# Patient Record
Sex: Male | Born: 1965 | Hispanic: No | Marital: Married | Smoking: Never smoker
Health system: Southern US, Community
[De-identification: ages and names within clinical notes are randomized; demographics above are authoritative.]

## PROBLEM LIST (undated history)

## (undated) DIAGNOSIS — I10 Essential (primary) hypertension: Secondary | ICD-10-CM

## (undated) HISTORY — PX: OTHER SURGICAL HISTORY: SHX169

---

## 2006-01-03 ENCOUNTER — Emergency Department (HOSPITAL_COMMUNITY): Admission: EM | Admit: 2006-01-03 | Discharge: 2006-01-04 | Payer: Self-pay | Admitting: Emergency Medicine

## 2007-02-12 IMAGING — CR DG CHEST 2V
2 series · 2 of 2 positions shown · non-contrast
Comparison: None.

CLINICAL DATA: Shortness of breath.
 CHEST - 2 VIEW - 01/04/06:

[w chest pa]
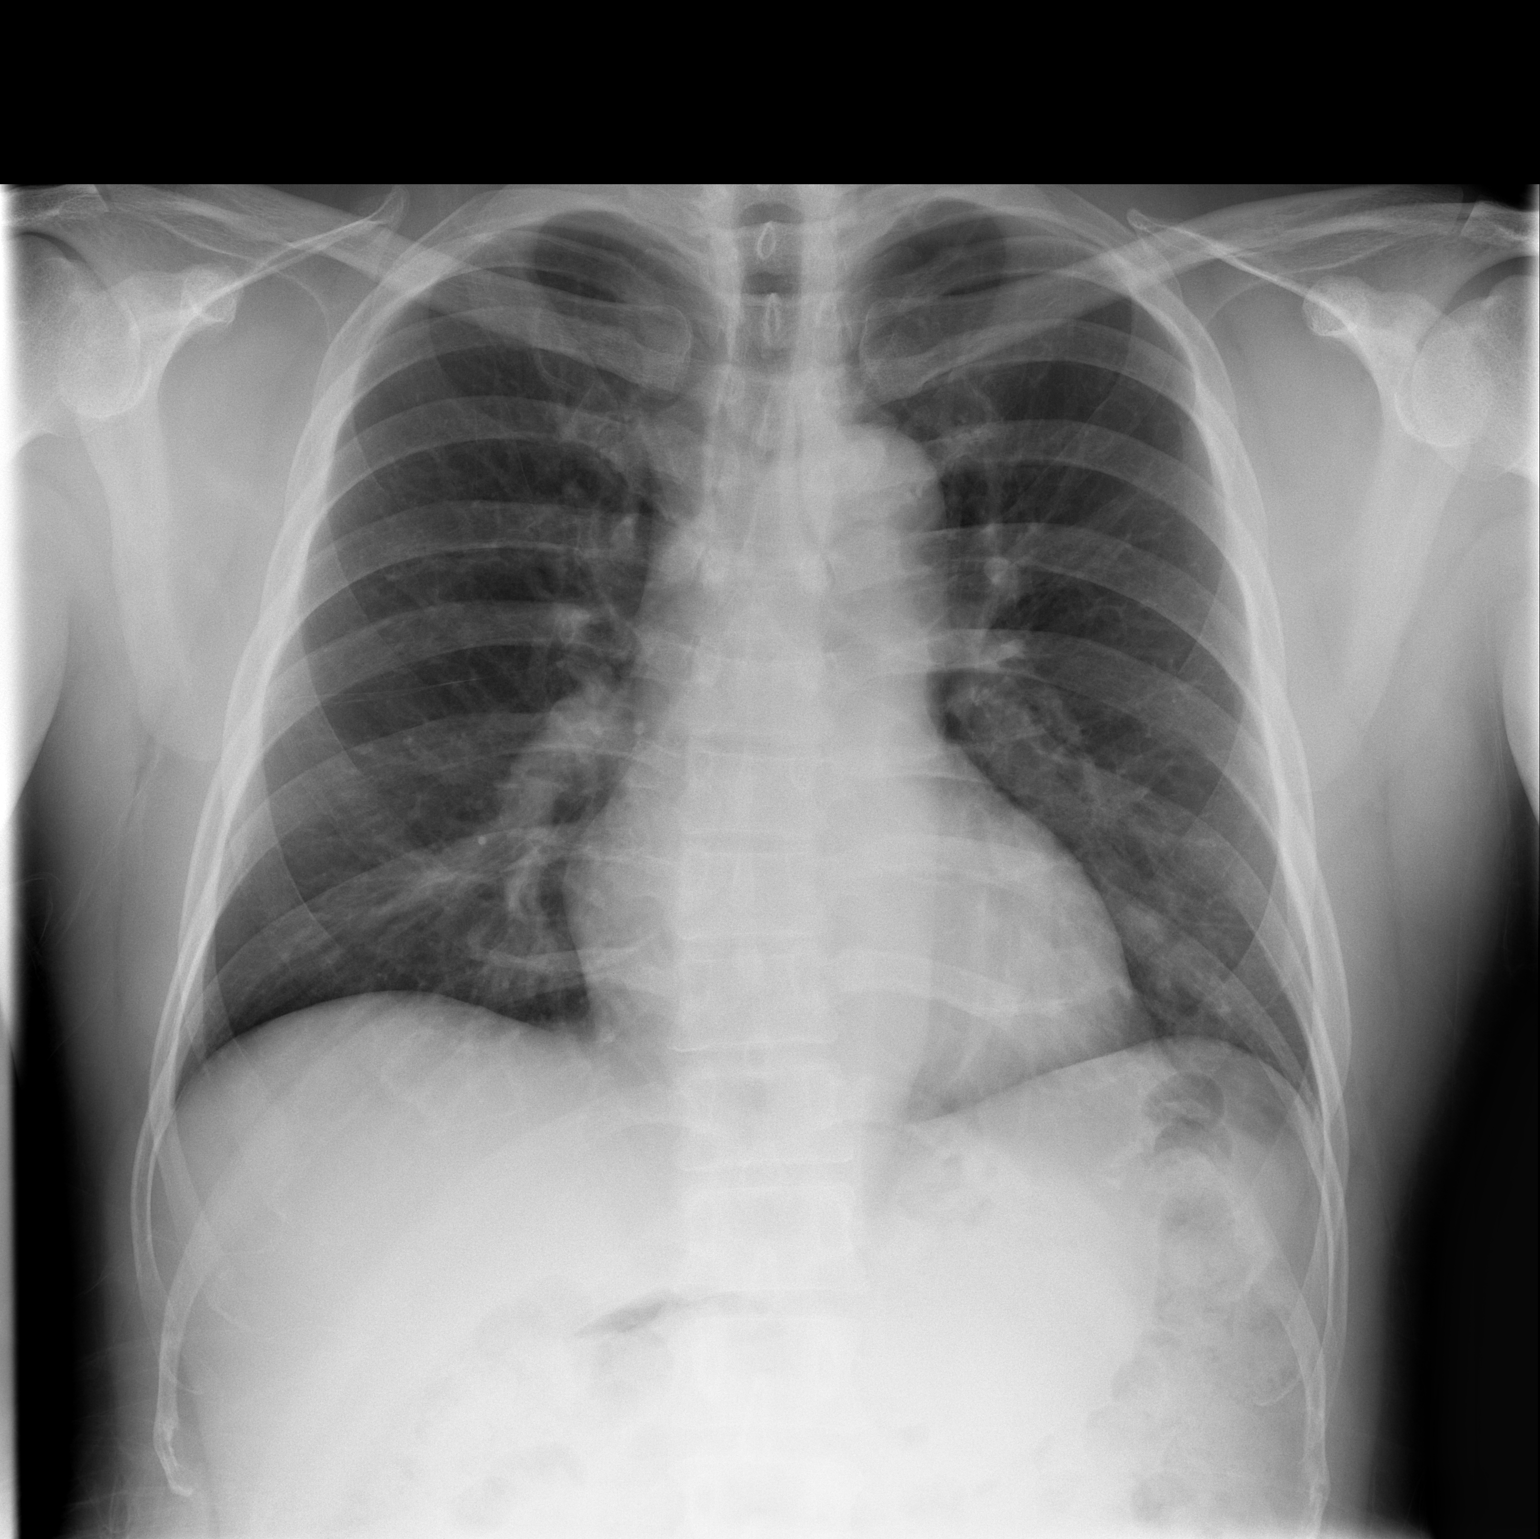

[w chest lat]
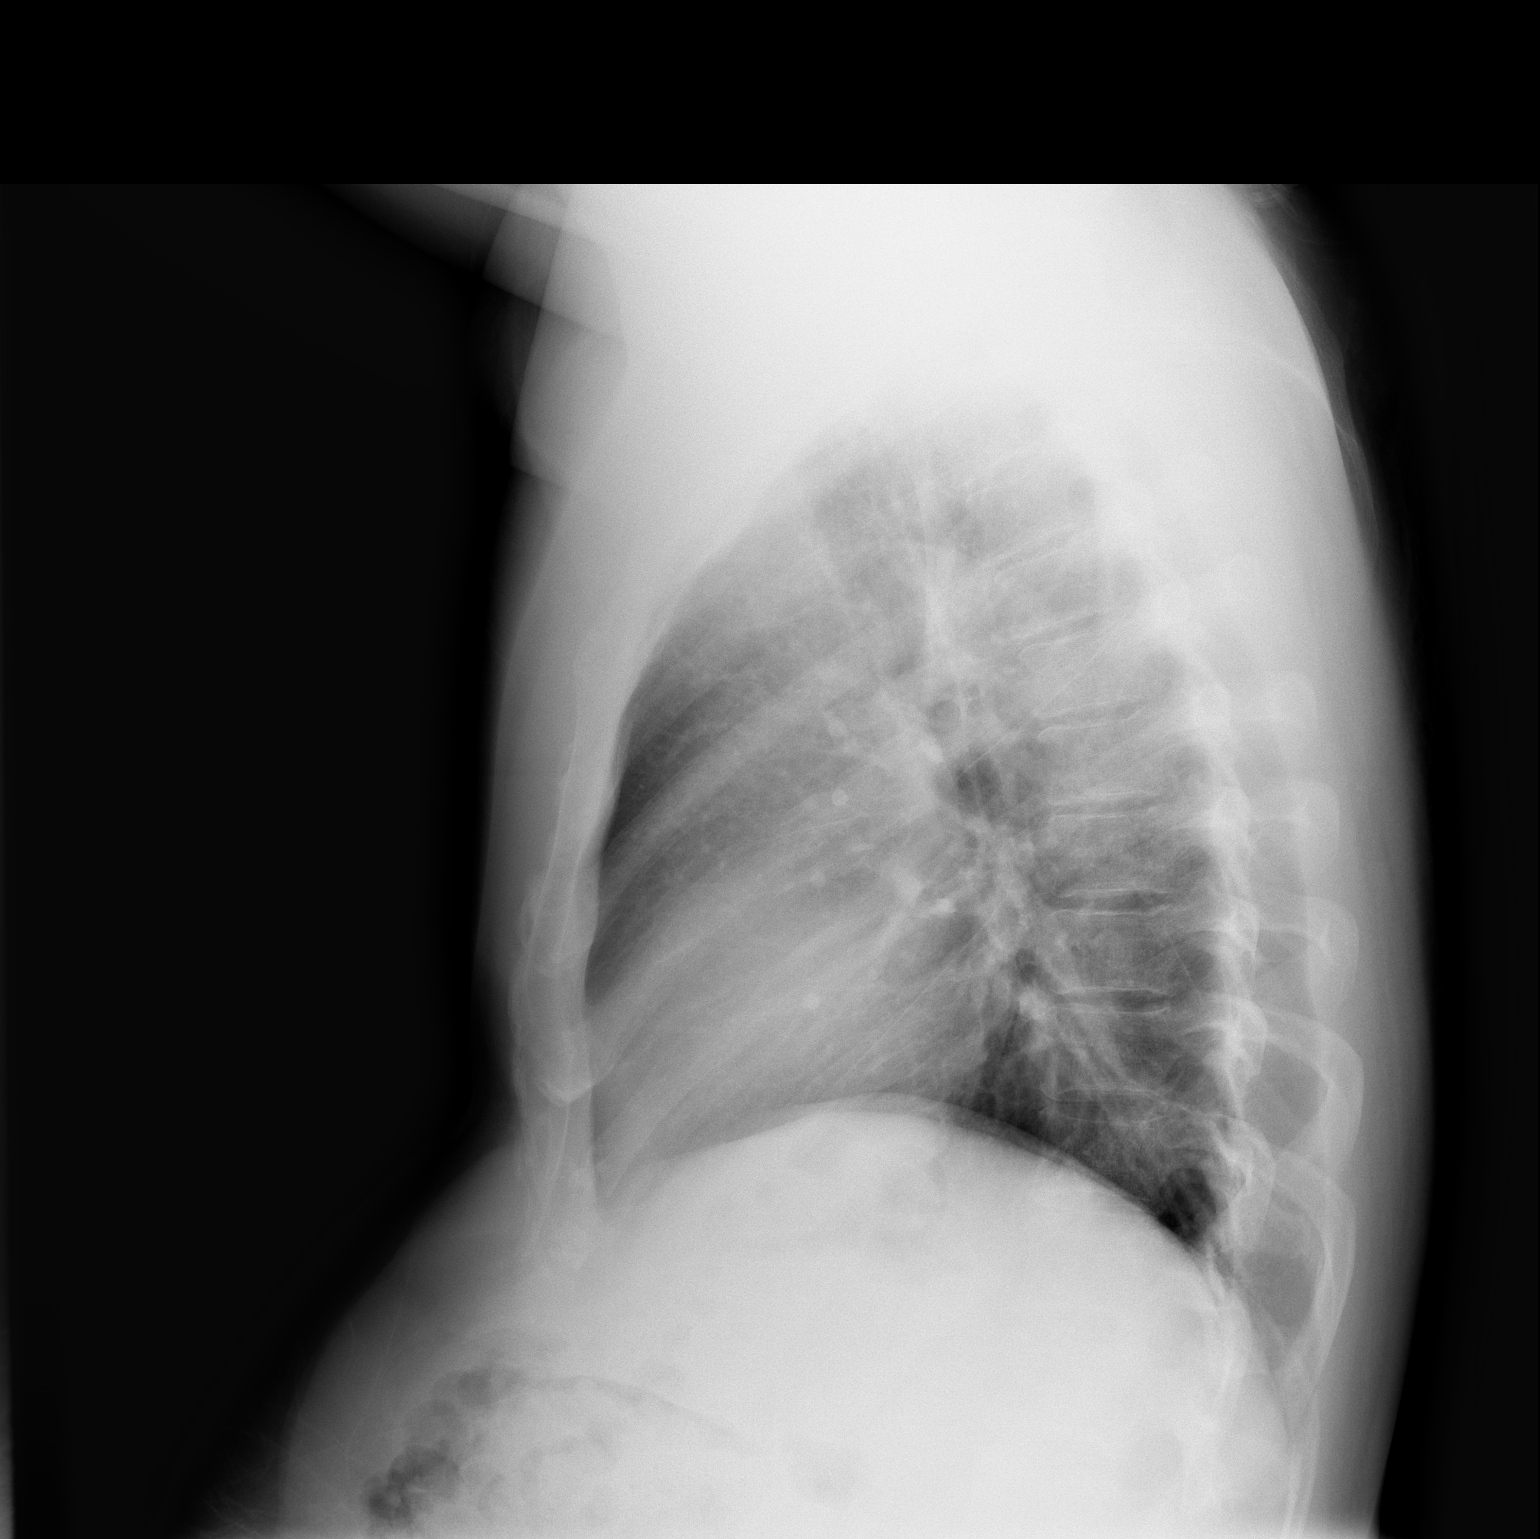

[2 of 2 positions shown; findings below may reference images not displayed]

FINDINGS: The heart size and mediastinal contours are within normal limits.  Both lungs are clear.  The visualized skeletal structures are within normal limits.
IMPRESSION: No active cardiopulmonary disease.

## 2016-03-23 ENCOUNTER — Encounter (HOSPITAL_BASED_OUTPATIENT_CLINIC_OR_DEPARTMENT_OTHER): Payer: Self-pay | Admitting: *Deleted

## 2016-03-23 ENCOUNTER — Emergency Department (HOSPITAL_BASED_OUTPATIENT_CLINIC_OR_DEPARTMENT_OTHER)
Admission: EM | Admit: 2016-03-23 | Discharge: 2016-03-23 | Disposition: A | Discharge status: Home / Self Care | Payer: 59 | Attending: Emergency Medicine | Admitting: Emergency Medicine

## 2016-03-23 DIAGNOSIS — Z79899 Other long term (current) drug therapy: Secondary | ICD-10-CM | POA: Insufficient documentation

## 2016-03-23 DIAGNOSIS — Y929 Unspecified place or not applicable: Secondary | ICD-10-CM | POA: Diagnosis not present

## 2016-03-23 DIAGNOSIS — W208XXA Other cause of strike by thrown, projected or falling object, initial encounter: Secondary | ICD-10-CM | POA: Diagnosis not present

## 2016-03-23 DIAGNOSIS — Y93E5 Activity, floor mopping and cleaning: Secondary | ICD-10-CM | POA: Insufficient documentation

## 2016-03-23 DIAGNOSIS — I10 Essential (primary) hypertension: Secondary | ICD-10-CM | POA: Diagnosis not present

## 2016-03-23 DIAGNOSIS — S0181XA Laceration without foreign body of other part of head, initial encounter: Secondary | ICD-10-CM | POA: Diagnosis present

## 2016-03-23 DIAGNOSIS — Y999 Unspecified external cause status: Secondary | ICD-10-CM | POA: Diagnosis not present

## 2016-03-23 HISTORY — DX: Essential (primary) hypertension: I10

## 2016-03-23 MED ORDER — TETANUS-DIPHTH-ACELL PERTUSSIS 5-2.5-18.5 LF-MCG/0.5 IM SUSP
0.5000 mL | Freq: Once | INTRAMUSCULAR | Status: AC
  Administered 2016-03-23: 0.5 mL via INTRAMUSCULAR
  Filled 2016-03-23: qty 0.5

## 2016-03-23 NOTE — ED Notes (Signed)
Patient states he was cleaning a grill and the lid fell on his forehead.  No loc.

## 2016-03-23 NOTE — Discharge Instructions (Signed)
Laceration Care, Adult  A laceration is a cut that goes through all of the layers of the skin and into the tissue that is right under the skin. Some lacerations heal on their own. Others need to be closed with stitches (sutures), staples, skin adhesive strips, or skin glue. Proper laceration care minimizes the risk of infection and helps the laceration to heal better.  HOW TO CARE FOR YOUR LACERATION  If sutures or staples were used:   Keep the wound clean and dry.   If you were given a bandage (dressing), you should change it at least one time per day or as told by your health care provider. You should also change it if it becomes wet or dirty.   Keep the wound completely dry for the first 24 hours or as told by your health care provider. After that time, you may shower or bathe. However, make sure that the wound is not soaked in water until after the sutures or staples have been removed.   Clean the wound one time each day or as told by your health care provider:    Wash the wound with soap and water.    Rinse the wound with water to remove all soap.    Pat the wound dry with a clean towel. Do not rub the wound.   After cleaning the wound, apply a thin layer of antibiotic ointmentas told by your health care provider. This will help to prevent infection and keep the dressing from sticking to the wound.   Have the sutures or staples removed as told by your health care provider.  If skin adhesive strips were used:   Keep the wound clean and dry.   If you were given a bandage (dressing), you should change it at least one time per day or as told by your health care provider. You should also change it if it becomes dirty or wet.   Do not get the skin adhesive strips wet. You may shower or bathe, but be careful to keep the wound dry.   If the wound gets wet, pat it dry with a clean towel. Do not rub the wound.   Skin adhesive strips fall off on their own. You may trim the strips as the wound heals. Do not  remove skin adhesive strips that are still stuck to the wound. They will fall off in time.  If skin glue was used:   Try to keep the wound dry, but you may briefly wet it in the shower or bath. Do not soak the wound in water, such as by swimming.   After you have showered or bathed, gently pat the wound dry with a clean towel. Do not rub the wound.   Do not do any activities that will make you sweat heavily until the skin glue has fallen off on its own.   Do not apply liquid, cream, or ointment medicine to the wound while the skin glue is in place. Using those may loosen the film before the wound has healed.   If you were given a bandage (dressing), you should change it at least one time per day or as told by your health care provider. You should also change it if it becomes dirty or wet.   If a dressing is placed over the wound, be careful not to apply tape directly over the skin glue. Doing that may cause the glue to be pulled off before the wound has healed.   Do   not pick at the glue. The skin glue usually remains in place for 5-10 days, then it falls off of the skin.  General Instructions   Take over-the-counter and prescription medicines only as told by your health care provider.   If you were prescribed an antibiotic medicine or ointment, take or apply it as told by your doctor. Do not stop using it even if your condition improves.   To help prevent scarring, make sure to cover your wound with sunscreen whenever you are outside after stitches are removed, after adhesive strips are removed, or when glue remains in place and the wound is healed. Make sure to wear a sunscreen of at least 30 SPF.   Do not scratch or pick at the wound.   Keep all follow-up visits as told by your health care provider. This is important.   Check your wound every day for signs of infection. Watch for:    Redness, swelling, or pain.    Fluid, blood, or pus.   Raise (elevate) the injured area above the level of your heart  while you are sitting or lying down, if possible.  SEEK MEDICAL CARE IF:   You received a tetanus shot and you have swelling, severe pain, redness, or bleeding at the injection site.   You have a fever.   A wound that was closed breaks open.   You notice a bad smell coming from your wound or your dressing.   You notice something coming out of the wound, such as wood or glass.   Your pain is not controlled with medicine.   You have increased redness, swelling, or pain at the site of your wound.   You have fluid, blood, or pus coming from your wound.   You notice a change in the color of your skin near your wound.   You need to change the dressing frequently due to fluid, blood, or pus draining from the wound.   You develop a new rash.   You develop numbness around the wound.  SEEK IMMEDIATE MEDICAL CARE IF:   You develop severe swelling around the wound.   Your pain suddenly increases and is severe.   You develop painful lumps near the wound or on skin that is anywhere on your body.   You have a red streak going away from your wound.   The wound is on your hand or foot and you cannot properly move a finger or toe.   The wound is on your hand or foot and you notice that your fingers or toes look pale or bluish.     This information is not intended to replace advice given to you by your health care provider. Make sure you discuss any questions you have with your health care provider.     Document Released: 09/16/2005 Document Revised: 01/31/2015 Document Reviewed: 09/12/2014  [employer]vier Interactive Patient Education 2016 [employer]  Head Injury, Adult  You have received a head injury. It does not appear serious at this time. Headaches and vomiting are common following head injury. It should be easy to awaken from sleeping. Sometimes it is necessary for you to stay in the emergency department for a while for observation. Sometimes admission to the hospital may be needed. After injuries such as yours,  most problems occur within the first 24 hours, but side effects may occur up to 7-10 days after the injury. It is important for you to carefully monitor your condition and contact your health care provider   or seek immediate medical care if there is a change in your condition.  WHAT ARE THE TYPES OF HEAD INJURIES?  Head injuries can be as minor as a bump. Some head injuries can be more severe. More severe head injuries include:   A jarring injury to the brain (concussion).   A bruise of the brain (contusion). This mean there is bleeding in the brain that can cause swelling.   A cracked skull (skull fracture).   Bleeding in the brain that collects, clots, and forms a bump (hematoma).  WHAT CAUSES A HEAD INJURY?  A serious head injury is most likely to happen to someone who is in a car wreck and is not wearing a seat belt. Other causes of major head injuries include bicycle or motorcycle accidents, sports injuries, and falls.  HOW ARE HEAD INJURIES DIAGNOSED?  A complete history of the event leading to the injury and your current symptoms will be helpful in diagnosing head injuries. Many times, pictures of the brain, such as CT or MRI are needed to see the extent of the injury. Often, an overnight hospital stay is necessary for observation.   WHEN SHOULD I SEEK IMMEDIATE MEDICAL CARE?   You should get help right away if:   You have confusion or drowsiness.   You feel sick to your stomach (nauseous) or have continued, forceful vomiting.   You have dizziness or unsteadiness that is getting worse.   You have severe, continued headaches not relieved by medicine. Only take over-the-counter or prescription medicines for pain, fever, or discomfort as directed by your health care provider.   You do not have normal function of the arms or legs or are unable to walk.   You notice changes in the black spots in the center of the colored part of your eye (pupil).   You have a clear or bloody fluid coming from your nose  or ears.   You have a loss of vision.  During the next 24 hours after the injury, you must stay with someone who can watch you for the warning signs. This person should contact local emergency services (911 in the [place] if you have seizures, you become unconscious, or you are unable to wake up.  HOW CAN I PREVENT A HEAD INJURY IN THE FUTURE?  The most important factor for preventing major head injuries is avoiding motor vehicle accidents. To minimize the potential for damage to your head, it is crucial to wear seat belts while riding in motor vehicles. Wearing helmets while bike riding and playing collision sports (like football) is also helpful. Also, avoiding dangerous activities around the house will further help reduce your risk of head injury.   WHEN CAN I RETURN TO NORMAL ACTIVITIES AND ATHLETICS?  You should be reevaluated by your health care provider before returning to these activities. If you have any of the following symptoms, you should not return to activities or contact sports until 1 week after the symptoms have stopped:   Persistent headache.   Dizziness or vertigo.   Poor attention and concentration.   Confusion.   Memory problems.   Nausea or vomiting.   Fatigue or tire easily.   Irritability.   Intolerant of bright lights or loud noises.   Anxiety or depression.   Disturbed sleep.  MAKE SURE YOU:    Understand these instructions.   Will watch your condition.   Will get help right away if you are not doing well or get worse.       This information is not intended to replace advice given to you by your health care provider. Make sure you discuss any questions you have with your health care provider.     Document Released: 09/16/2005 Document Revised: 10/07/2014 Document Reviewed: 05/24/2013  Elsevier Interactive Patient Education 2016 [employer]

## 2016-03-23 NOTE — ED Provider Notes (Signed)
CSN: [id number]     Arrival date & time 03/23/16  1131 History   First MD Initiated Contact with Patient 03/23/16 1415     Chief Complaint  Patient presents with  . Head Laceration     (Consider location/radiation/quality/duration/timing/severity/associated sxs/prior Treatment) HPI Patient struck his forehead on the lid of the outdoor grill this morning at 11 AM. Sustained a 2 cm laceration. Bleeding is controlled. No loss of consciousness. Initially had mild held headache which is resolved. No nausea or vomiting. No other injury. Unknown last tetanus  Past Medical History  Diagnosis Date  . Hypertension    Past Surgical History  Procedure Laterality Date  . Vastectomy     No family history on file. Social History  Substance Use Topics  . Smoking status: Never Smoker   . Smokeless tobacco: Never Used  . Alcohol Use: No    Review of Systems  Constitutional: Negative for fever and chills.  HENT: Negative for facial swelling.   Eyes: Negative for visual disturbance.  Gastrointestinal: Negative for nausea and vomiting.  Musculoskeletal: Negative for neck pain.  Skin: Positive for wound.  Neurological: Positive for headaches. Negative for syncope, weakness, light-headedness and numbness.  All other systems reviewed and are negative.     Allergies  Review of patient's allergies indicates no known allergies.  Home Medications   Prior to Admission medications   Medication Sig Start Date End Date Taking? Authorizing Provider  amLODipine (NORVASC) 5 MG tablet Take 5 mg by mouth daily.   Yes Historical Provider, MD  hydrochlorothiazide (MICROZIDE) 12.5 MG capsule Take 12.5 mg by mouth daily.   Yes Historical Provider, MD  metoprolol (LOPRESSOR) 100 MG tablet Take 100 mg by mouth 2 (two) times daily.   Yes Historical Provider, MD   BP 126/91 mmHg  Pulse 65  Temp(Src) 98.2 F (36.8 C) (Oral)  Resp 16  Ht 5\' 5"  (1.651 m)  Wt 190 lb (86.183 kg)  BMI 31.62 kg/m2  SpO2  98% Physical Exam  Constitutional: He is oriented to person, place, and time. He appears well-developed and well-nourished. No distress.  HENT:  Head: Normocephalic.  Mouth/Throat: Oropharynx is clear and moist.  2 cm superficial, linear laceration to the midforehead. No active bleeding or gross contamination. No underlying bony tenderness, swelling or deformity.  Eyes: EOM are normal. Pupils are equal, round, and reactive to light.  Neck: Normal range of motion. Neck supple.  No posterior midline cervical tenderness to palpation.  Cardiovascular: Normal rate and regular rhythm.  Exam reveals no gallop and no friction rub.   No murmur heard. Pulmonary/Chest: Effort normal and breath sounds normal. No respiratory distress. He has no wheezes. He has no rales.  Abdominal: Soft. Bowel sounds are normal.  Musculoskeletal: Normal range of motion. He exhibits no edema or tenderness.  Neurological: He is alert and oriented to person, place, and time.  5/5 motor in all extremity. Sensation is intact.  Skin: Skin is warm and dry. No rash noted. No erythema.  Psychiatric: He has a normal mood and affect. His behavior is normal.  Nursing note and vitals reviewed.   ED Course  .Marland Kitchen.Laceration Repair Date/Time: 03/23/2016 3:33 PM Performed by: Loren RacerYELVERTON, Adeja Sarratt Authorized by: Ranae PalmsYELVERTON, Bandon Sherwin Body area: head/neck Location details: forehead Laceration length: 2 cm Foreign bodies: no foreign bodies Tendon involvement: none Nerve involvement: none Vascular damage: no Irrigation solution: saline Amount of cleaning: standard Skin closure: glue Approximation: close Approximation difficulty: simple Patient tolerance: Patient tolerated the procedure well with  no immediate complications   (including critical care time) Labs Review Labs Reviewed - No data to display  Imaging Review No results found. I have personally reviewed and evaluated these images and lab results as part of my medical  decision-making.   EKG Interpretation None      MDM   Final diagnoses:  Forehead laceration, initial encounter    Tetanus updated and wound repaired. Head injury precautions given.    Loren Raceravid Juana Haralson, MD 03/23/16 [id number]

## 2016-03-23 NOTE — ED Notes (Signed)
Pt and wife given d/c instructions as per chart. Verbalizes understanding. No questions.
